# Patient Record
Sex: Male | Born: 1995 | Race: White | Hispanic: No | Marital: Single | Smoking: Never smoker
Health system: Southern US, Community
[De-identification: ages and names within clinical notes are randomized; demographics above are authoritative.]

## PROBLEM LIST (undated history)

## (undated) DIAGNOSIS — G4700 Insomnia, unspecified: Secondary | ICD-10-CM

## (undated) DIAGNOSIS — G8929 Other chronic pain: Secondary | ICD-10-CM

## (undated) DIAGNOSIS — J45909 Unspecified asthma, uncomplicated: Secondary | ICD-10-CM

## (undated) DIAGNOSIS — R42 Dizziness and giddiness: Secondary | ICD-10-CM

## (undated) DIAGNOSIS — E559 Vitamin D deficiency, unspecified: Secondary | ICD-10-CM

## (undated) HISTORY — DX: Dizziness and giddiness: R42

## (undated) HISTORY — DX: Other chronic pain: G89.29

## (undated) HISTORY — DX: Insomnia, unspecified: G47.00

## (undated) HISTORY — DX: Vitamin D deficiency, unspecified: E55.9

## (undated) HISTORY — DX: Unspecified asthma, uncomplicated: J45.909

---

## 2016-10-24 ENCOUNTER — Ambulatory Visit: Payer: Self-pay | Admitting: Physician Assistant

## 2016-10-24 ENCOUNTER — Encounter: Payer: Self-pay | Admitting: Physician Assistant

## 2016-10-24 VITALS — BP 124/90 | HR 81 | Temp 98.1°F | Ht 68.0 in | Wt 188.0 lb

## 2016-10-24 DIAGNOSIS — J4520 Mild intermittent asthma, uncomplicated: Secondary | ICD-10-CM | POA: Insufficient documentation

## 2016-10-24 DIAGNOSIS — Z131 Encounter for screening for diabetes mellitus: Secondary | ICD-10-CM

## 2016-10-24 DIAGNOSIS — Z13220 Encounter for screening for lipoid disorders: Secondary | ICD-10-CM

## 2016-10-24 MED ORDER — ALBUTEROL SULFATE HFA 108 (90 BASE) MCG/ACT IN AERS: 2.0000 | INHALATION_SPRAY | Freq: Four times a day (QID) | RESPIRATORY_TRACT | 1 refills | Status: DC | PRN

## 2016-10-24 MED ORDER — ALBUTEROL SULFATE HFA 108 (90 BASE) MCG/ACT IN AERS: 2.0000 | INHALATION_SPRAY | Freq: Four times a day (QID) | RESPIRATORY_TRACT | 0 refills | Status: DC | PRN

## 2016-10-24 NOTE — Progress Notes (Signed)
BP 124/90 (BP Location: Right Arm, Patient Position: Sitting, Cuff Size: Normal)   Pulse 81   Temp 98.1 F (36.7 C) (Other (Comment))   Ht 5\' 8"  (1.727 m)   Wt 188 lb (85.3 kg)   SpO2 96%   BMI 28.59 kg/m    Subjective:    Patient ID: Christopher Stephens, male    DOB: 09/13/1995, 21 y.o.   MRN: [id number]  HPI: Christopher Stephens is a 21 y.o. male presenting on 10/24/2016 for New Patient (Initial Visit)   HPI   Pt was on inhaler in the past but he is out.   That is main reason pt is here.   He says he uses the inhaler about twice/week unless he is having problems with allergies or is sick.  Pt dienies other problems.  Relevant past medical, surgical, family and social history reviewed and updated as indicated. Interim medical history since our last visit reviewed. Allergies and medications reviewed and updated.   Current Outpatient Prescriptions:  .  cetirizine (ZYRTEC) 10 MG tablet, Take 10 mg by mouth daily., Disp: , Rfl:    Review of Systems  Constitutional: Negative for appetite change, chills, diaphoresis, fatigue, fever and unexpected weight change.  HENT: Positive for congestion, dental problem and sneezing. Negative for drooling, ear pain, facial swelling, hearing loss, mouth sores, sore throat, trouble swallowing and voice change.   Eyes: Negative for pain, discharge, redness, itching and visual disturbance.  Respiratory: Positive for shortness of breath and wheezing. Negative for cough and choking.   Cardiovascular: Negative for chest pain, palpitations and leg swelling.  Gastrointestinal: Negative for abdominal pain, blood in stool, constipation, diarrhea and vomiting.  Endocrine: Negative for cold intolerance, heat intolerance and polydipsia.  Genitourinary: Negative for decreased urine volume, dysuria and hematuria.  Musculoskeletal: Positive for back pain. Negative for arthralgias and gait problem.  Skin: Negative for rash.  Allergic/Immunologic: Positive for  environmental allergies.  Neurological: Negative for seizures, syncope, light-headedness and headaches.  Hematological: Negative for adenopathy.  Psychiatric/Behavioral: Positive for dysphoric mood. Negative for agitation and suicidal ideas. The patient is nervous/anxious.     Per HPI unless specifically indicated above     Objective:    BP 124/90 (BP Location: Right Arm, Patient Position: Sitting, Cuff Size: Normal)   Pulse 81   Temp 98.1 F (36.7 C) (Other (Comment))   Ht 5\' 8"  (1.727 m)   Wt 188 lb (85.3 kg)   SpO2 96%   BMI 28.59 kg/m   Wt Readings from Last 3 Encounters:  10/24/16 188 lb (85.3 kg)    Physical Exam  Constitutional: He is oriented to person, place, and time. He appears well-developed and well-nourished.  HENT:  Head: Normocephalic and atraumatic.  Mouth/Throat: Oropharynx is clear and moist. No oropharyngeal exudate.  Eyes: Pupils are equal, round, and reactive to light. Conjunctivae and EOM are normal.  Neck: Neck supple. No thyromegaly present.  Cardiovascular: Normal rate and regular rhythm.   Pulmonary/Chest: Effort normal and breath sounds normal. He has no wheezes. He has no rales.  Abdominal: Soft. Bowel sounds are normal. He exhibits no mass. There is no hepatosplenomegaly. There is no tenderness.  Musculoskeletal: He exhibits no edema.  Lymphadenopathy:    He has no cervical adenopathy.  Neurological: He is alert and oriented to person, place, and time.  Skin: Skin is warm and dry. No rash noted.  Psychiatric: He has a normal mood and affect. His behavior is normal. Thought content normal.  Vitals reviewed.  No results found for this or any previous visit.    Assessment & Plan:   Encounter Diagnoses  Name Primary?  . Intermittent asthma without complication, unspecified asthma severity Yes  . Screening cholesterol level   . Screening for diabetes mellitus      -Check fasting labs. Will call pt with results -rx Albuterol.  -signed  pt up for [facility] -pt to F/u 6 months.  RTO sooner prn

## 2016-10-25 ENCOUNTER — Other Ambulatory Visit (HOSPITAL_COMMUNITY)
Admission: RE | Admit: 2016-10-25 | Discharge: 2016-10-25 | Disposition: A | Discharge status: Home / Self Care | Payer: Self-pay | Source: Ambulatory Visit | Attending: Physician Assistant | Admitting: Physician Assistant

## 2016-10-25 DIAGNOSIS — Z13220 Encounter for screening for lipoid disorders: Secondary | ICD-10-CM | POA: Insufficient documentation

## 2016-10-25 DIAGNOSIS — Z131 Encounter for screening for diabetes mellitus: Secondary | ICD-10-CM | POA: Insufficient documentation

## 2016-10-25 LAB — LIPID PANEL
CHOLESTEROL: 107 mg/dL (ref 0–200)
HDL: 32 mg/dL — AB (ref >40)
LDL Cholesterol: 56 mg/dL (ref 0–99)
TRIGLYCERIDES: 97 mg/dL (ref <150)
Total CHOL/HDL Ratio: 3.3 RATIO
VLDL: 19 mg/dL (ref 0–40)

## 2016-10-26 LAB — HEMOGLOBIN A1C
Hgb A1c MFr Bld: 4.5 % — ABNORMAL LOW (ref 4.8–5.6)
Mean Plasma Glucose: 82 mg/dL

## 2017-04-26 ENCOUNTER — Ambulatory Visit: Payer: Self-pay | Admitting: Physician Assistant

## 2017-05-08 ENCOUNTER — Encounter: Payer: Self-pay | Admitting: Physician Assistant

## 2019-02-27 ENCOUNTER — Other Ambulatory Visit: Payer: Self-pay

## 2019-02-27 DIAGNOSIS — Z20822 Contact with and (suspected) exposure to covid-19: Secondary | ICD-10-CM

## 2019-03-01 LAB — NOVEL CORONAVIRUS, NAA: SARS-CoV-2, NAA: NOT DETECTED

## 2019-09-09 ENCOUNTER — Other Ambulatory Visit: Payer: Self-pay

## 2019-09-09 ENCOUNTER — Ambulatory Visit (HOSPITAL_COMMUNITY)
Admission: EM | Admit: 2019-09-09 | Discharge: 2019-09-09 | Disposition: A | Discharge status: Home / Self Care | Payer: 59 | Attending: Family Medicine | Admitting: Family Medicine

## 2019-09-09 ENCOUNTER — Encounter (HOSPITAL_COMMUNITY): Payer: Self-pay

## 2019-09-09 DIAGNOSIS — R42 Dizziness and giddiness: Secondary | ICD-10-CM

## 2019-09-09 DIAGNOSIS — R112 Nausea with vomiting, unspecified: Secondary | ICD-10-CM | POA: Diagnosis not present

## 2019-09-09 DIAGNOSIS — R5383 Other fatigue: Secondary | ICD-10-CM

## 2019-09-09 MED ORDER — ALBUTEROL SULFATE HFA 108 (90 BASE) MCG/ACT IN AERS: 1.0000 | INHALATION_SPRAY | Freq: Four times a day (QID) | RESPIRATORY_TRACT | 0 refills | Status: AC | PRN

## 2019-09-09 MED ORDER — ONDANSETRON 4 MG PO TBDP
4.0000 mg | ORAL_TABLET | Freq: Three times a day (TID) | ORAL | 0 refills | Status: AC | PRN
Start: 2019-09-09 — End: ?

## 2019-09-09 NOTE — Discharge Instructions (Signed)
Please use Zofran around-the-clock every 6-8 hours as needed for nausea/vomiting Focus on fluids-Pedialyte/Gatorade Rest Tylenol as needed for headaches Please monitor your temperature, abdominal pain, dizziness and symptoms over the next few days, if any symptoms worsening or not improving with the above please follow-up for reevaluation

## 2019-09-09 NOTE — ED Triage Notes (Signed)
Pt presents with headache, dizziness, and vomiting X 3 hours today.

## 2019-09-10 NOTE — ED Provider Notes (Signed)
[facility]    CSN: [id number] Arrival date & time: 09/09/19  1759      History   Chief Complaint Chief Complaint  Patient presents with  . Fatigue  . Dizziness  . Vomiting    HPI Christopher Stephens is a 24 y.o. male history of asthma presenting today for evaluation of acute onset of dizziness, nausea vomiting and headache.  Patient reports on his way to work today he began to feel really hot and lightheaded.  He subsequently had some nausea with a couple episodes of vomiting.  He did attempt to drink Gatorade and was having difficulty keeping this down.  Does report some abdominal pain and associated churning sensation.  Denies diarrhea or change of bowel movements.  Denies URI symptoms.  Reports he has a history of similar when he was feeling dehydrated/overheated.  He was only able to work for a couple of hours before coming here.  Denies any close sick contacts with similar symptoms.  HPI  Past Medical History:  Diagnosis Date  . Asthma     Patient Active Problem List   Diagnosis Date Noted  . Intermittent asthma without complication 10/24/2016    History reviewed. No pertinent surgical history.     Home Medications    Prior to Admission medications   Medication Sig Start Date End Date Taking? Authorizing Provider  albuterol (VENTOLIN HFA) 108 (90 Base) MCG/ACT inhaler Inhale 1-2 puffs into the lungs every 6 (six) hours as needed for wheezing or shortness of breath. 09/09/19   Mckinsey Keagle C, PA-C  cetirizine (ZYRTEC) 10 MG tablet Take 10 mg by mouth daily.    [provider]  ondansetron (ZOFRAN ODT) 4 MG disintegrating tablet Take 1 tablet (4 mg total) by mouth every 8 (eight) hours as needed for nausea or vomiting. 09/09/19   Aceyn Kathol, Junius Creamer, PA-C    Family History Family History  Problem Relation Age of Onset  . Heart disease Mother   . Kidney disease Father     Social History Social History   Tobacco Use  . Smoking status: Never Smoker   . Smokeless tobacco: Never Used  Substance Use Topics  . Alcohol use: No  . Drug use: No     Allergies   Azithromycin and Sulfa antibiotics   Review of Systems Review of Systems  Constitutional: Positive for fatigue. Negative for activity change, appetite change, chills and fever.  HENT: Negative for congestion, ear pain, rhinorrhea, sinus pressure, sore throat and trouble swallowing.   Eyes: Negative for photophobia, pain, discharge, redness and visual disturbance.  Respiratory: Negative for cough, chest tightness and shortness of breath.   Cardiovascular: Negative for chest pain.  Gastrointestinal: Positive for abdominal pain, nausea and vomiting. Negative for diarrhea.  Genitourinary: Negative for decreased urine volume and hematuria.  Musculoskeletal: Negative for myalgias, neck pain and neck stiffness.  Skin: Negative for rash.  Neurological: Positive for light-headedness and headaches. Negative for dizziness, syncope, facial asymmetry, speech difficulty, weakness and numbness.     Physical Exam Triage Vital Signs ED Triage Vitals [09/09/19 1913]  Enc Vitals Group     BP 124/83     Pulse Rate 66     Resp 17     Temp 98.4 F (36.9 C)     Temp Source Oral     SpO2 96 %     Weight      Height      Head Circumference      Peak Flow  Pain Score 5     Pain Loc      Pain Edu?      Excl. in GC?    No data found.  Updated Vital Signs BP 124/83 (BP Location: Right Arm)   Pulse 66   Temp 98.4 F (36.9 C) (Oral)   Resp 17   SpO2 96%   Visual Acuity Right Eye Distance:   Left Eye Distance:   Bilateral Distance:    Right Eye Near:   Left Eye Near:    Bilateral Near:     Physical Exam Vitals and nursing note reviewed.  Constitutional:      Appearance: He is well-developed.  HENT:     Head: Normocephalic and atraumatic.     Right Ear: Tympanic membrane normal.     Left Ear: Tympanic membrane normal.     Ears:     Comments: Bilateral ears without  tenderness to palpation of external auricle, tragus and mastoid, EAC's without erythema or swelling, TM's with good bony landmarks and cone of light. Non erythematous.     Mouth/Throat:     Comments: Oral mucosa pink and moist, no tonsillar enlargement or exudate. Posterior pharynx patent and nonerythematous, no uvula deviation or swelling. Normal phonation. Eyes:     Extraocular Movements: Extraocular movements intact.     Conjunctiva/sclera: Conjunctivae normal.     Pupils: Pupils are equal, round, and reactive to light.     Comments: No nystagmus  Cardiovascular:     Rate and Rhythm: Normal rate and regular rhythm.     Heart sounds: No murmur.  Pulmonary:     Effort: Pulmonary effort is normal. No respiratory distress.     Breath sounds: Normal breath sounds.     Comments: Breathing comfortably at rest, CTABL, no wheezing, rales or other adventitious sounds auscultated Abdominal:     Palpations: Abdomen is soft.     Tenderness: There is abdominal tenderness.     Comments: Soft, nondistended, tender to palpation in epigastrium and left upper quadrant, negative rebound, negative Murphy's, negative Rovsing, negative McBurney's  Musculoskeletal:     Cervical back: Neck supple.  Skin:    General: Skin is warm and dry.  Neurological:     General: No focal deficit present.     Mental Status: He is alert and oriented to person, place, and time. Mental status is at baseline.     Cranial Nerves: No cranial nerve deficit.     Motor: No weakness.     Gait: Gait normal.     Comments: Ambulates from chair to exam table without assistance or abnormality      UC Treatments / Results  Labs (all labs ordered are listed, but only abnormal results are displayed) Labs Reviewed - No data to display  EKG   Radiology No results found.  Procedures Procedures (including critical care time)  Medications Ordered in UC Medications - No data to display  Initial Impression / Assessment and  Plan / UC Course  I have reviewed the triage vital signs and the nursing notes.  Pertinent labs & imaging results that were available during my care of the patient were reviewed by me and considered in my medical decision making (see chart for details).     Less than 12 hours of fatigue, nausea and vomiting, abdominal pain negative for peritoneal signs.  Suspect most likely viral gastroenteritis versus overheating.  Do not suspect abdominal emergency at this time, at this time will treat symptomatically and supportively with  Zofran for nausea rest fluids Tylenol for headache with close monitoring of symptoms.  Recommended to follow-up if symptoms not improving or worsening/changing.  Discussed strict return precautions. Patient verbalized understanding and is agreeable with plan.  Final Clinical Impressions(s) / UC Diagnoses   Final diagnoses:  Dizziness  Non-intractable vomiting with nausea, unspecified vomiting type  Fatigue, unspecified type     Discharge Instructions     Please use Zofran around-the-clock every 6-8 hours as needed for nausea/vomiting Focus on fluids-Pedialyte/Gatorade Rest Tylenol as needed for headaches Please monitor your temperature, abdominal pain, dizziness and symptoms over the next few days, if any symptoms worsening or not improving with the above please follow-up for reevaluation   ED Prescriptions    Medication Sig Dispense Auth. Provider   albuterol (VENTOLIN HFA) 108 (90 Base) MCG/ACT inhaler Inhale 1-2 puffs into the lungs every 6 (six) hours as needed for wheezing or shortness of breath. 18 g Chandell Attridge C, PA-C   ondansetron (ZOFRAN ODT) 4 MG disintegrating tablet Take 1 tablet (4 mg total) by mouth every 8 (eight) hours as needed for nausea or vomiting. 20 tablet Onyx Schirmer, Kennan C, PA-C     PDMP not reviewed this encounter.   Joneen Caraway Juarez C, [place] 09/10/19 [id number]

## 2019-09-20 ENCOUNTER — Emergency Department (HOSPITAL_COMMUNITY)
Admission: EM | Admit: 2019-09-20 | Discharge: 2019-09-20 | Disposition: A | Discharge status: Home / Self Care | Payer: 59 | Attending: Emergency Medicine | Admitting: Emergency Medicine

## 2019-09-20 ENCOUNTER — Emergency Department (HOSPITAL_COMMUNITY): Payer: 59

## 2019-09-20 ENCOUNTER — Encounter (HOSPITAL_COMMUNITY): Payer: Self-pay

## 2019-09-20 DIAGNOSIS — S39012A Strain of muscle, fascia and tendon of lower back, initial encounter: Secondary | ICD-10-CM | POA: Insufficient documentation

## 2019-09-20 DIAGNOSIS — Y92410 Unspecified street and highway as the place of occurrence of the external cause: Secondary | ICD-10-CM | POA: Diagnosis not present

## 2019-09-20 DIAGNOSIS — J45909 Unspecified asthma, uncomplicated: Secondary | ICD-10-CM | POA: Diagnosis not present

## 2019-09-20 DIAGNOSIS — S3992XA Unspecified injury of lower back, initial encounter: Secondary | ICD-10-CM | POA: Diagnosis present

## 2019-09-20 DIAGNOSIS — Y999 Unspecified external cause status: Secondary | ICD-10-CM | POA: Insufficient documentation

## 2019-09-20 DIAGNOSIS — R519 Headache, unspecified: Secondary | ICD-10-CM | POA: Insufficient documentation

## 2019-09-20 DIAGNOSIS — Y93I9 Activity, other involving external motion: Secondary | ICD-10-CM | POA: Diagnosis not present

## 2019-09-20 DIAGNOSIS — Z79899 Other long term (current) drug therapy: Secondary | ICD-10-CM | POA: Diagnosis not present

## 2019-09-20 MED ORDER — KETOROLAC TROMETHAMINE 15 MG/ML IJ SOLN
15.0000 mg | Freq: Once | INTRAMUSCULAR | Status: AC
  Administered 2019-09-20: 15 mg via INTRAVENOUS
  Filled 2019-09-20: qty 1

## 2019-09-20 MED ORDER — METHOCARBAMOL 500 MG PO TABS: 500.0000 mg | ORAL_TABLET | Freq: Three times a day (TID) | ORAL | 0 refills | Status: AC | PRN

## 2019-09-20 MED ORDER — OXYCODONE-ACETAMINOPHEN 5-325 MG PO TABS
1.0000 | ORAL_TABLET | Freq: Once | ORAL | Status: AC
  Administered 2019-09-20: 1 via ORAL
  Filled 2019-09-20: qty 1

## 2019-09-20 MED ORDER — NAPROXEN 500 MG PO TABS
500.0000 mg | ORAL_TABLET | Freq: Two times a day (BID) | ORAL | 0 refills | Status: AC | PRN
Start: 2019-09-20 — End: ?
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
## 2019-09-20 NOTE — ED Notes (Signed)
Patient verbalizes understanding of discharge instructions. Opportunity for questioning and answers were provided. Armband removed by staff, pt discharged from ED ambulatory.   

## 2019-09-20 NOTE — Discharge Instructions (Signed)
Recommend taking Tylenol, anti-inflammatories, muscle relaxers as prescribed.  Muscle relaxer can make you drowsy shot be taken while driving or operating heavy machinery.  Follow-up with primary doctor as needed.  If you develop chest pain, difficulty breathing, abdominal pain or other new concerning symptom, return to ER for reassessment.
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
## 2019-10-03 DIAGNOSIS — S060XAA Concussion with loss of consciousness status unknown, initial encounter: Secondary | ICD-10-CM

## 2019-10-03 DIAGNOSIS — S060X9A Concussion with loss of consciousness of unspecified duration, initial encounter: Secondary | ICD-10-CM

## 2019-10-03 HISTORY — DX: Concussion with loss of consciousness status unknown, initial encounter: S06.0XAA

## 2019-10-03 HISTORY — DX: Concussion with loss of consciousness of unspecified duration, initial encounter: S06.0X9A

## 2020-04-01 ENCOUNTER — Ambulatory Visit: Payer: 59 | Admitting: Diagnostic Neuroimaging

## 2020-04-29 ENCOUNTER — Encounter: Payer: Self-pay | Admitting: *Deleted

## 2020-05-05 ENCOUNTER — Telehealth: Payer: Self-pay | Admitting: *Deleted

## 2020-05-05 ENCOUNTER — Encounter: Payer: Self-pay | Admitting: Diagnostic Neuroimaging

## 2020-05-05 ENCOUNTER — Ambulatory Visit: Payer: 59 | Admitting: Diagnostic Neuroimaging

## 2020-05-05 NOTE — Telephone Encounter (Signed)
Patient was no show for new patient appointment today. 
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
